# Patient Record
Sex: Male | Born: 2000 | Race: Black or African American | Hispanic: No | Marital: Single | Smoking: Never smoker
Health system: Southern US, Community
[De-identification: ages and names within clinical notes are randomized; demographics above are authoritative.]

## PROBLEM LIST (undated history)

## (undated) DIAGNOSIS — Z789 Other specified health status: Secondary | ICD-10-CM

## (undated) HISTORY — DX: Other specified health status: Z78.9

---
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
## 2023-03-21 IMAGING — DX DG CHEST 1V PORT
1 series · 1 of 1 positions shown · non-contrast
Comparison: None.

CLINICAL DATA: Chest pain.

EXAM:
PORTABLE CHEST 1 VIEW

[chest ap]
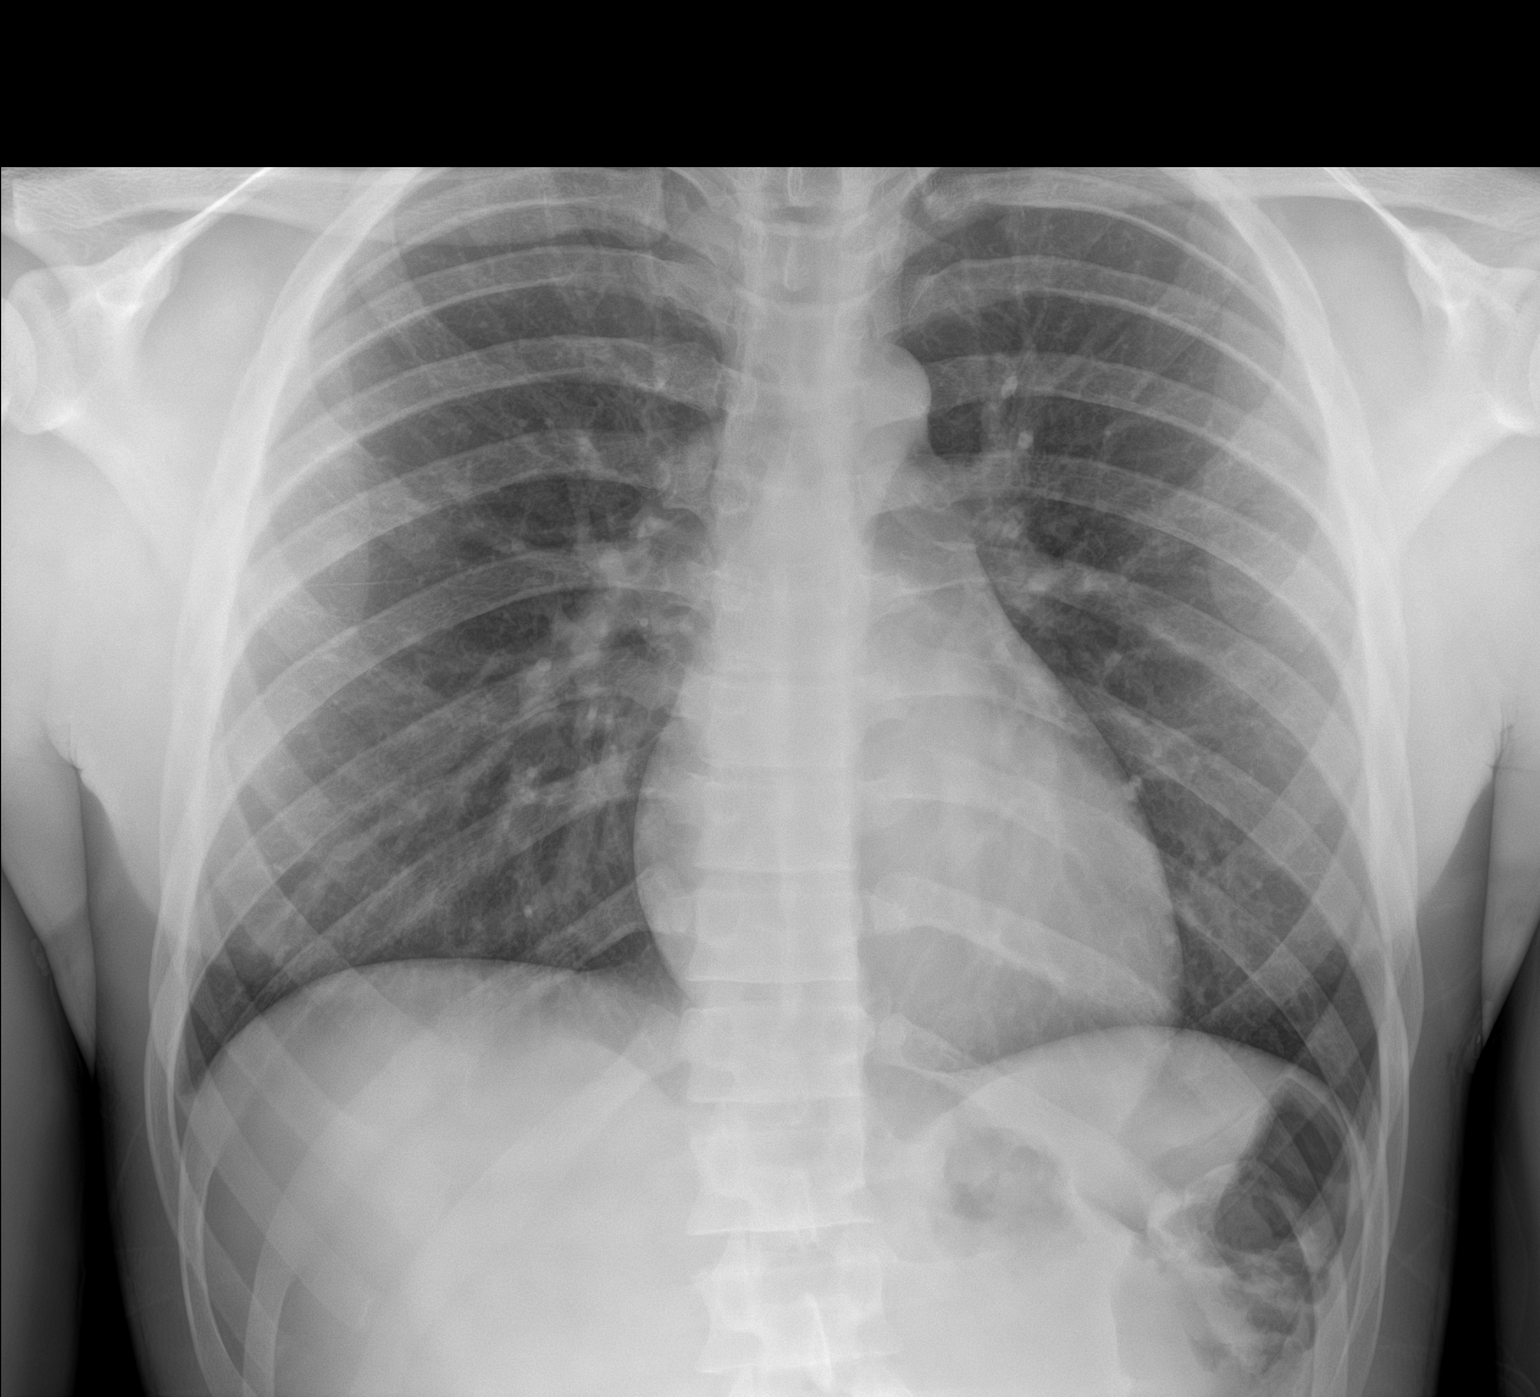

[1 of 1 positions shown; findings below may reference images not displayed]

FINDINGS: The heart size and mediastinal contours are within normal limits.
Both lungs are clear. The visualized skeletal structures are
unremarkable.
IMPRESSION: No active disease.
